# Patient Record
Sex: Male | Born: 1980 | Race: Black or African American | Hispanic: No | Marital: Married | Smoking: Never smoker
Health system: Southern US, Community
[De-identification: ages and names within clinical notes are randomized; demographics above are authoritative.]

## PROBLEM LIST (undated history)

## (undated) DIAGNOSIS — J309 Allergic rhinitis, unspecified: Secondary | ICD-10-CM

## (undated) DIAGNOSIS — G4730 Sleep apnea, unspecified: Secondary | ICD-10-CM

## (undated) DIAGNOSIS — K219 Gastro-esophageal reflux disease without esophagitis: Secondary | ICD-10-CM

## (undated) HISTORY — DX: Sleep apnea, unspecified: G47.30

## (undated) HISTORY — DX: Gastro-esophageal reflux disease without esophagitis: K21.9

## (undated) HISTORY — DX: Allergic rhinitis, unspecified: J30.9

---

## 2003-08-30 ENCOUNTER — Emergency Department (HOSPITAL_COMMUNITY): Admission: EM | Admit: 2003-08-30 | Discharge: 2003-08-31 | Payer: Self-pay | Admitting: *Deleted

## 2004-07-23 ENCOUNTER — Emergency Department (HOSPITAL_COMMUNITY): Admission: EM | Admit: 2004-07-23 | Discharge: 2004-07-24 | Payer: Self-pay | Admitting: Emergency Medicine

## 2005-03-29 IMAGING — CR DG CHEST 2V
2 series · 2 of 2 positions shown · non-contrast
Comparison: none

CLINICAL DATA: Cough, fever.
 CHEST TWO VIEWS
 No prior study for comparison.
 Normal heart size, mediastinal contours and vascularity.  Lungs clear.  Bones unremarkable.  
 IMPRESSION
 No acute abnormalities.

[view not recorded (1 of 2)]
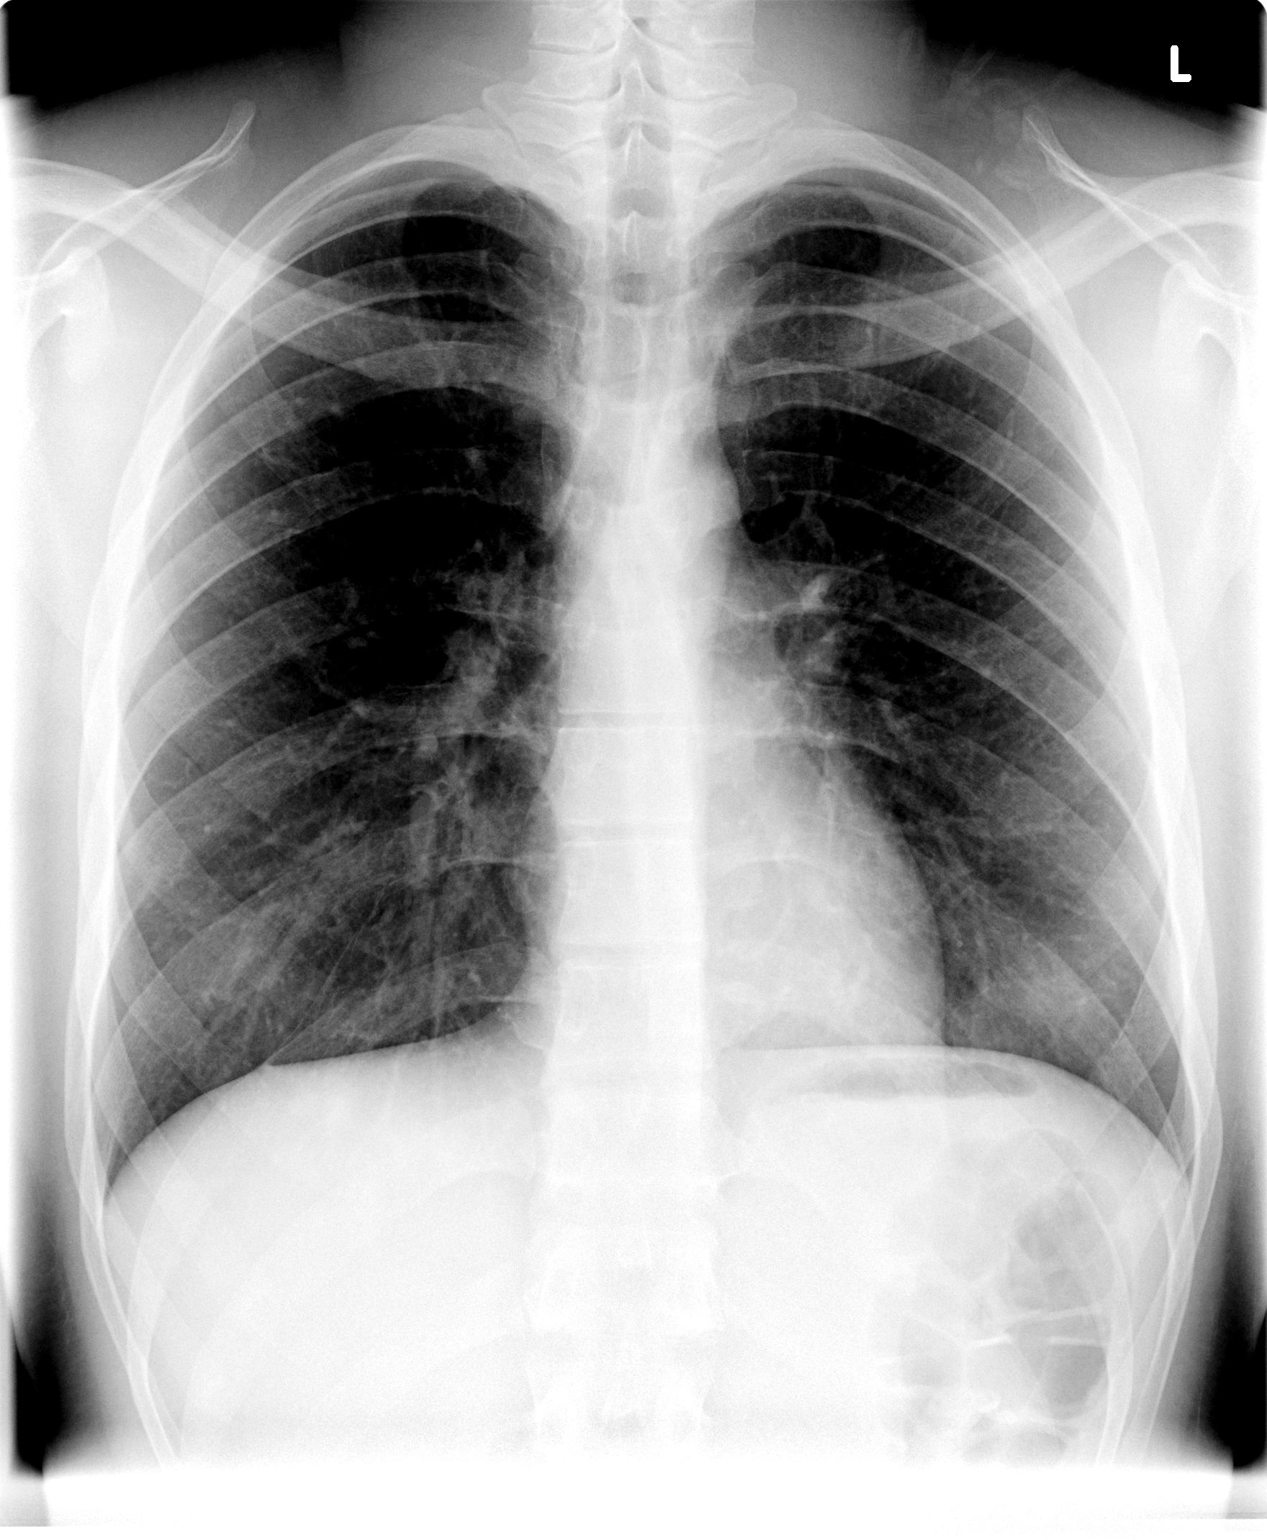

[view not recorded (2 of 2)]
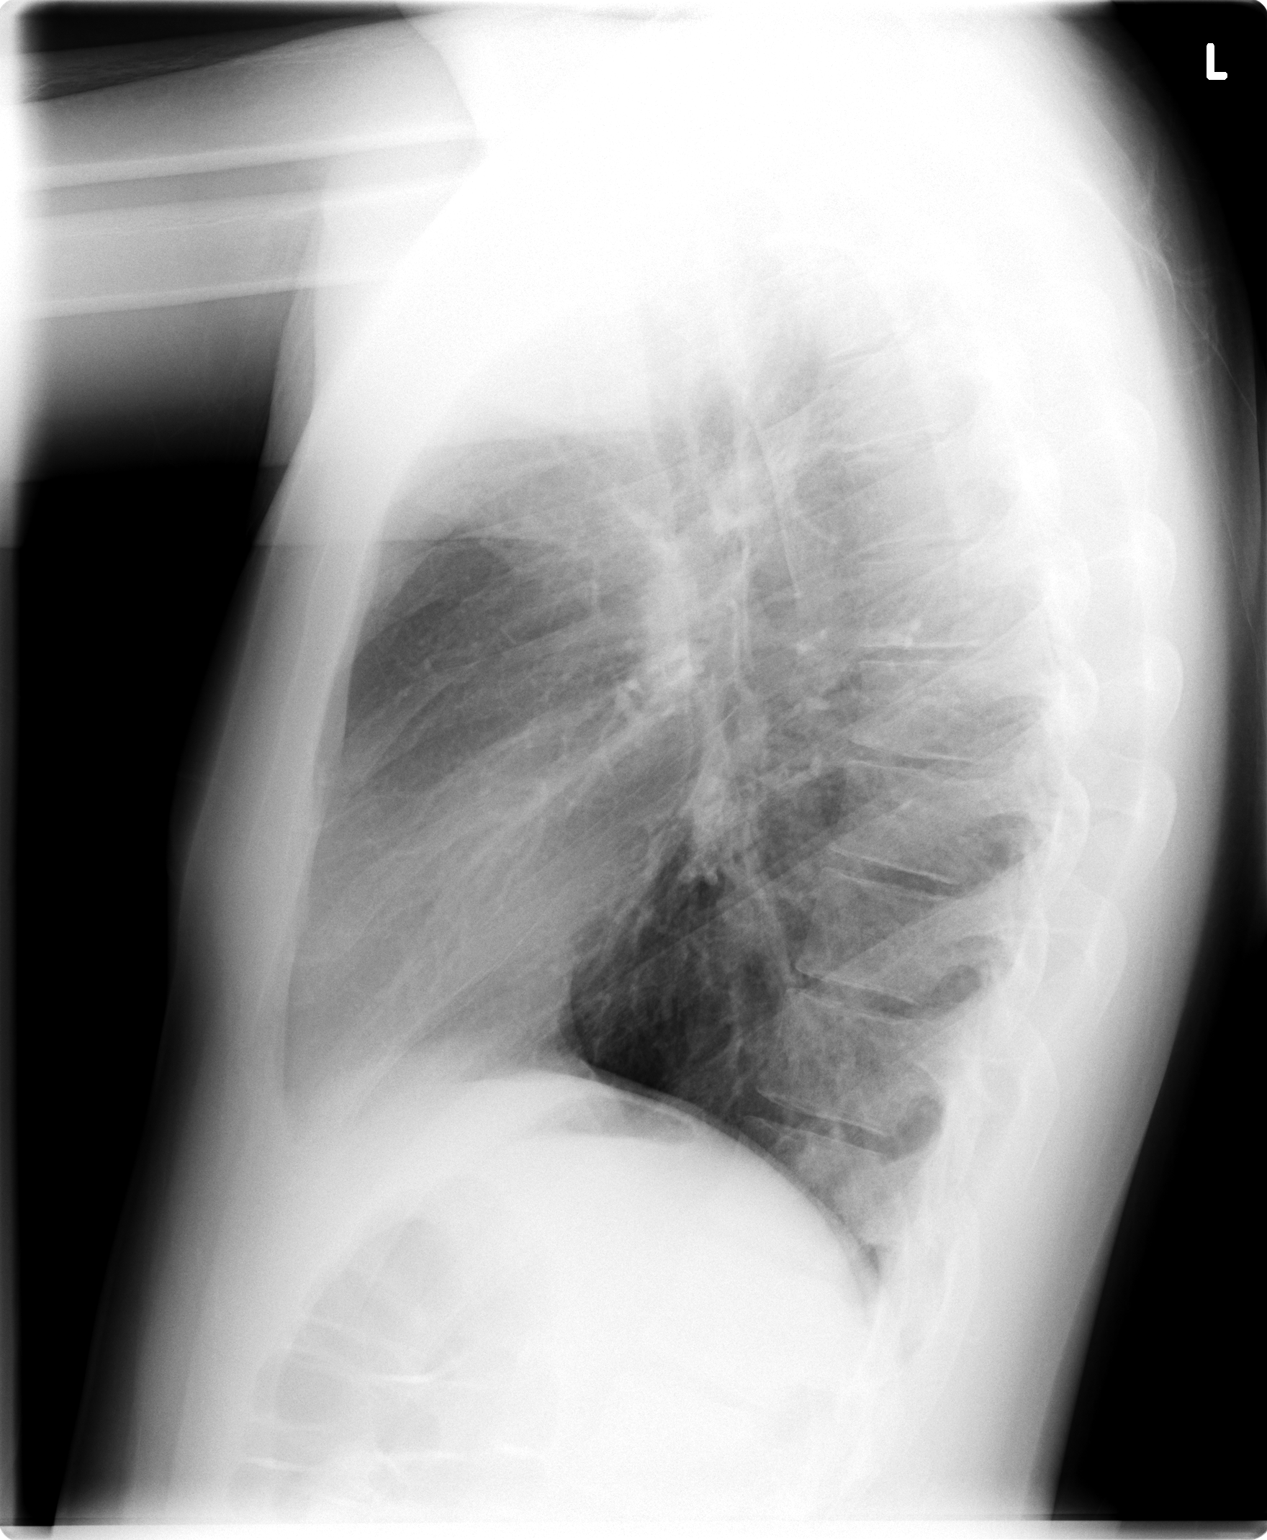

[2 of 2 positions shown; findings below may reference images not displayed]

## 2005-07-03 ENCOUNTER — Ambulatory Visit: Payer: Self-pay | Admitting: Family Medicine

## 2006-02-20 IMAGING — CR DG CHEST 1V PORT
1 series · 1 of 1 positions shown · non-contrast
Comparison: 08/30/03.

CLINICAL DATA: Chest pain. 
 PORTABLE CHEST - 1 VIEW, 07/23/04:

[view not recorded]
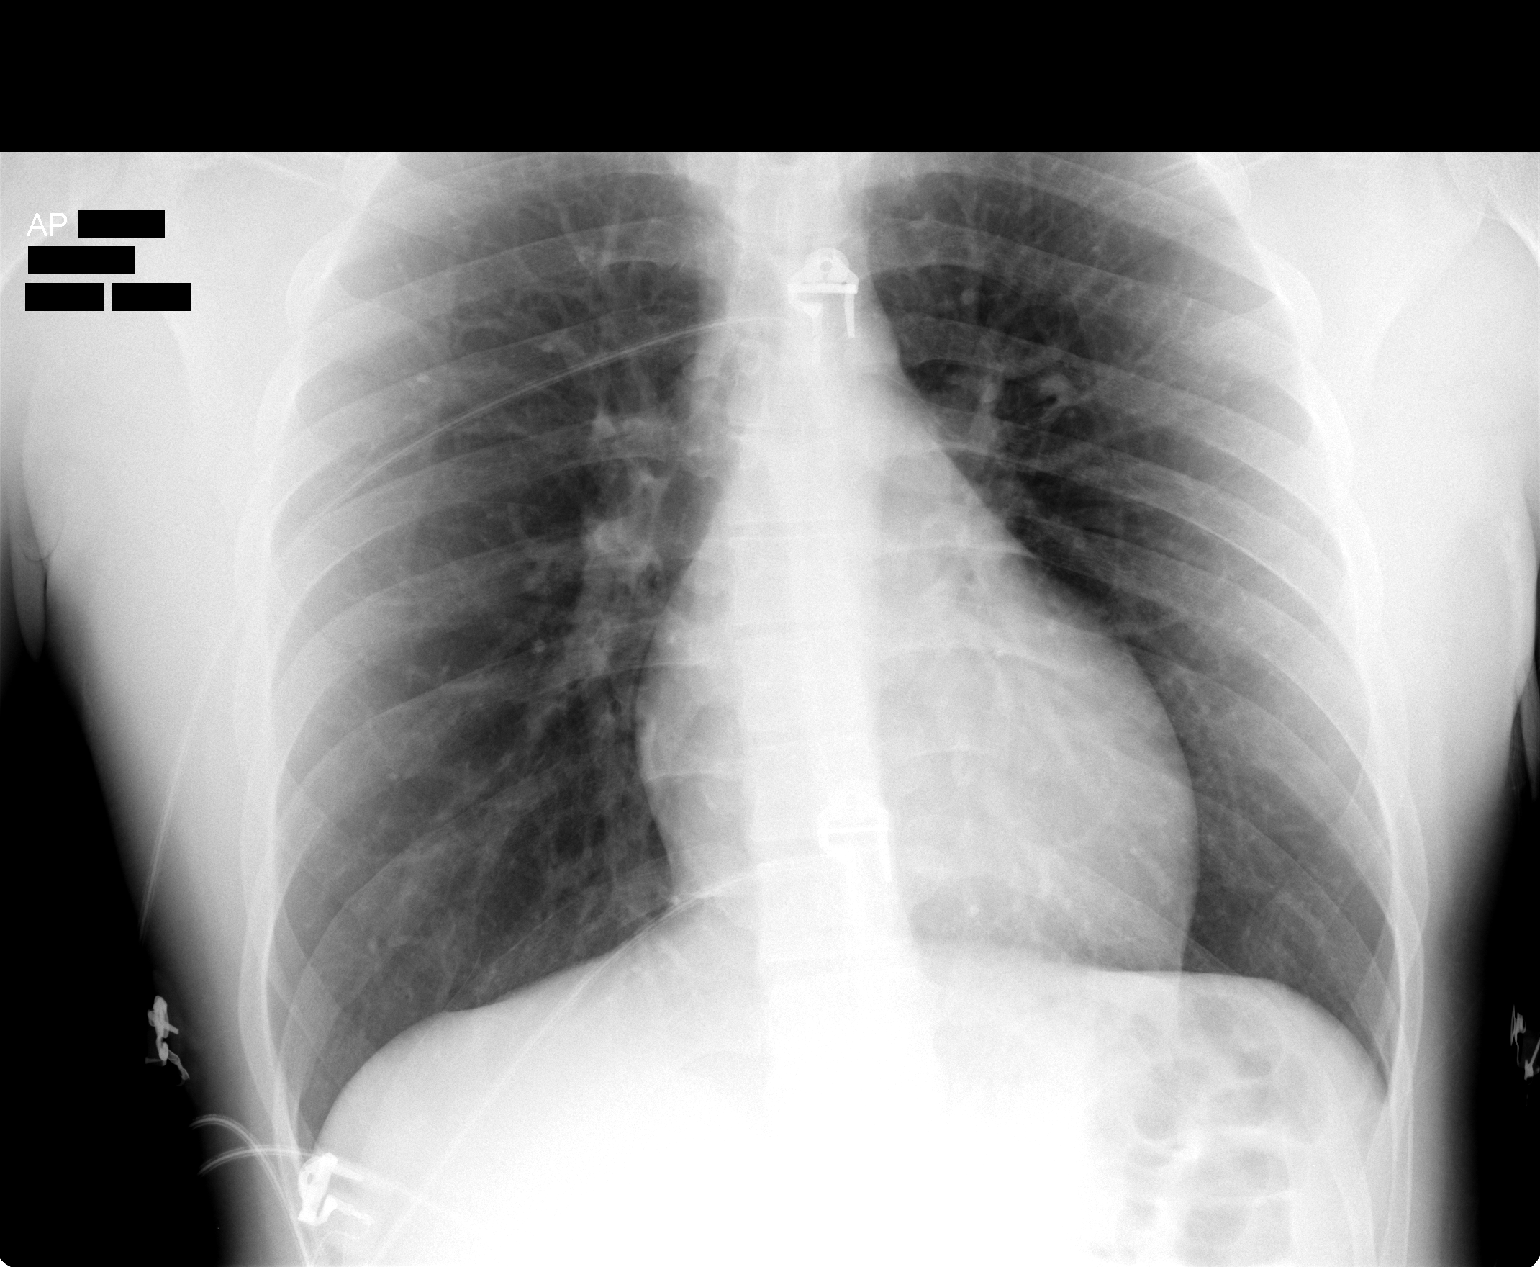

[1 of 1 positions shown; findings below may reference images not displayed]

The heart size and mediastinal contours are within normal limits. The lungs are clear.
IMPRESSION: No acute disease.

## 2007-07-17 ENCOUNTER — Encounter: Payer: Self-pay | Admitting: Family Medicine

## 2010-08-15 NOTE — Letter (Signed)
Summary: Historic Patient File  Historic Patient File   Imported By: Lind Guest 04/17/2010 12:50:40  _____________________________________________________________________  External Attachment:    Type:   Image     Comment:   External Document

## 2016-01-04 ENCOUNTER — Other Ambulatory Visit (HOSPITAL_COMMUNITY): Payer: Self-pay | Admitting: Respiratory Therapy

## 2016-01-04 DIAGNOSIS — G4733 Obstructive sleep apnea (adult) (pediatric): Secondary | ICD-10-CM

## 2019-05-15 ENCOUNTER — Other Ambulatory Visit: Payer: Self-pay

## 2019-05-15 DIAGNOSIS — Z20822 Contact with and (suspected) exposure to covid-19: Secondary | ICD-10-CM

## 2019-05-16 LAB — NOVEL CORONAVIRUS, NAA: SARS-CoV-2, NAA: DETECTED — AB

## 2019-06-17 ENCOUNTER — Other Ambulatory Visit: Payer: Self-pay

## 2019-06-17 DIAGNOSIS — Z20822 Contact with and (suspected) exposure to covid-19: Secondary | ICD-10-CM

## 2019-06-21 LAB — NOVEL CORONAVIRUS, NAA: SARS-CoV-2, NAA: NOT DETECTED

## 2021-08-21 ENCOUNTER — Ambulatory Visit: Payer: Self-pay | Admitting: Urology

## 2021-09-04 ENCOUNTER — Ambulatory Visit (INDEPENDENT_AMBULATORY_CARE_PROVIDER_SITE_OTHER): Payer: BC Managed Care – PPO | Admitting: Urology

## 2021-09-04 ENCOUNTER — Other Ambulatory Visit: Payer: Self-pay

## 2021-09-04 ENCOUNTER — Encounter: Payer: Self-pay | Admitting: Urology

## 2021-09-04 VITALS — BP 152/83 | HR 91 | Ht 77.0 in | Wt 261.0 lb

## 2021-09-04 DIAGNOSIS — Z3009 Encounter for other general counseling and advice on contraception: Secondary | ICD-10-CM

## 2021-09-04 MED ORDER — ALPRAZOLAM 1 MG PO TABS: ORAL_TABLET | ORAL | 0 refills | Status: DC

## 2021-09-04 NOTE — Progress Notes (Signed)
Assessment: 1. Encounter for vasectomy counseling    Plan: Prescription for alprazolam provided. Schedule for vasectomy per patient request.  Chief Complaint:  Chief Complaint  Patient presents with   VAS Consult    History of Present Illness:  Gary Hubbard is a 41 y.o. year old male who is seen for evaluation for vasectomy. He is married with 3 children.  No history of scrotal trauma or infection.  Past Medical History:  Past Medical History:  Diagnosis Date   GERD (gastroesophageal reflux disease)     Past Surgical History:  History reviewed. No pertinent surgical history.  Allergies:  No Known Allergies  Family History:  History reviewed. No pertinent family history.  Social History:  Social History   Tobacco Use   Smoking status: Never   Smokeless tobacco: Never  Substance Use Topics   Alcohol use: Never    Review of symptoms:  Constitutional:  Negative for unexplained weight loss, night sweats, fever, chills ENT:  Negative for nose bleeds, sinus pain, painful swallowing CV:  Negative for chest pain, shortness of breath, exercise intolerance, palpitations, loss of consciousness Resp:  Negative for cough, wheezing, shortness of breath GI:  Negative for nausea, vomiting, diarrhea, bloody stools GU:  Positives noted in HPI; otherwise negative for gross hematuria, dysuria, urinary incontinence Neuro:  Negative for seizures, poor balance, limb weakness, slurred speech Psych:  Negative for lack of energy, depression, anxiety Endocrine:  Negative for polydipsia, polyuria, symptoms of hypoglycemia (dizziness, hunger, sweating) Hematologic:  Negative for anemia, purpura, petechia, prolonged or excessive bleeding, use of anticoagulants  Allergic:  Negative for difficulty breathing or choking as a result of exposure to anything; no shellfish allergy; no allergic response (rash/itch) to materials, foods  Physical exam: BP (!) 152/83    Pulse 91    Ht 6\' 5"   (1.956 m)    Wt 261 lb (118.4 kg)    BMI 30.95 kg/m  GENERAL APPEARANCE:  Well appearing, well developed, well nourished, NAD HEENT:  Atraumatic, normocephalic, oropharynx clear NECK:  Supple without lymphadenopathy or thyromegaly ABDOMEN:  Soft, non-tender, no masses EXTREMITIES:  Moves all extremities well, without clubbing, cyanosis, or edema NEUROLOGIC:  Alert and oriented x 3, normal gait, CN II-XII grossly intact MENTAL STATUS:  appropriate BACK:  Non-tender to palpation, No CVAT SKIN:  Warm, dry, and intact GU: Penis:  circumcised Meatus: Normal Scrotum: Thick cords bilaterally; vasa palpated bilaterally Testis: normal without masses bilateral Epididymis: normal  Results: None  VASECTOMY CONSULTATION  [name] presents for vasectomy consultation today.  He is a 41 y.o. male, Married with 3  children .  He and his wife have discussed the issues regarding long-term fertility and are comfortable with this decision.  He presents for consideration for vasectomy.  I discussed the issues in detail with him today and he expressed no reservations.  As to the procedure, no scalpel technique vasectomy is explained and reviewed in detail.  Generalized risks including but not limited to bleeding, infection, orchalgia, testicular atrophy, epididymitis, scrotal hematoma, and chronic pain are discussed.   Additionally, he understands that the possibility of vas recanalization following vasectomy is possible although rare.  Most importantly, the patient understands that he is not sterile initially and will need a semen analysis check to confirm sterility such that no sperm are seen.  He is advised to avoid ejaculation for 10 days following the procedure.  The initial semen analysis will be checked in approximately 12 weeks and in some patients,  several months may be required for clearance of all sperm.  He reports a clear understanding of the need for continued birth control until sterility is  confirmed.  Otherwise, general issues regarding local anesthesia, prep, alprazolam are discussed and he reports a clear understanding.

## 2021-09-04 NOTE — Patient Instructions (Signed)
Taking care of yourself after a VASECTOMY ? ?                                            Patient Information Sheet ? ?      ?The following information will reinforce some of the instructions that your doctor has given you. ? ?Day of Procedure: ?1) Wear the scrotal supporter and gauze pad ?2) Use an ice pack on the scrotum for 15 minutes every hour for 48 hours to help reduce discomfort, swelling and bruising (do NOT place ice directly on your skin, but place on top of the supporter) ?3) Expect some clear to pinkish drainage at the surgical site for the first 24-48 hours ?4) If needed, use pain medications provided or ibuprofen 800 mg every 8 hours for discomfort ?5) Avoid strenuous activities like mowing, lifting, jogging and exercising for 1 week.  Take it easy! ?6) If you develop a fever over 101 F or sudden onset of significant swelling within the first 12 hours, please call to report this to your doctor as soon as possible.   ? ? ?Day Two and Three: ?1) You may take a shower, but avoid tubs, pools or hot tubs. ?2) Continue to wear the scrotal supporter as needed for comfort and change or remove the gauze pad if desired ?3) Keep taking it easy!  Avoid strenuous activities like mowing, lifting, jogging and exercising.   ?4) Continue to watch for signs or symptoms of fever or significant swelling ?5) Apply a small amount of antibiotic ointment to incision 1-2 times/day ? ?The rest of the week: ?1) Gradually return to normal physical activities after one week.  A return of soreness might mean you are  ?      ?doing too much too soon?. ?2) Avoid sexual activity for 10 days after the procedure ?3) Continue to take a shower, but avoid tubs, pools or hot tubs ?4) Wearing the scrotal supporter is optional based on your comfort.   ? ? ?Remember to use an alternate form of contraception for 3 months until you have been checked and CLEARED by your urologist! ? ?3-Month Urology Clinic: ? 1) The urologist will need to look at  a semen sample under a microscope ? 2) Use the specimen cup provided to collect the sample AT HOME 1 hour before the appointment ? 3) DO NOT refrigerate the specimen, but keep at room or body temperature ? 4) Avoid ejaculation for 2-5 days before collecting the specimen ? 5) Collect the entire specimen by masturbation using NO lubricant ? 6) Make sure your name, MR number, date and time of collection are on the cup  ?

## 2021-10-12 ENCOUNTER — Ambulatory Visit (INDEPENDENT_AMBULATORY_CARE_PROVIDER_SITE_OTHER): Payer: BC Managed Care – PPO | Admitting: Urology

## 2021-10-12 VITALS — BP 126/65 | HR 86

## 2021-10-12 DIAGNOSIS — Z302 Encounter for sterilization: Secondary | ICD-10-CM

## 2021-10-12 MED ORDER — HYDROCODONE-ACETAMINOPHEN 5-325 MG PO TABS: 1.0000 | ORAL_TABLET | Freq: Four times a day (QID) | ORAL | 0 refills | Status: DC | PRN

## 2021-10-12 MED ORDER — CEPHALEXIN 500 MG PO CAPS: 500.0000 mg | ORAL_CAPSULE | Freq: Three times a day (TID) | ORAL | 0 refills | Status: AC

## 2021-10-12 NOTE — Progress Notes (Signed)
? ?  Assessment: ?1. Encounter for vasectomy   ? ? ?Plan: ?Postvasectomy instructions given. ?Prescription for pain medication and antibiotics provided. ?Post vasectomy semen analysis in 12 weeks. ? ?Chief Complaint:  ?Chief Complaint  ?Patient presents with  ? VAS  ? ? ?History of Present Illness: ? ?Gary Hubbard is a 41 y.o. year old male who is seen for  vasectomy. ?He is married with 3 children.  No history of scrotal trauma or infection. ? ?Past Medical History:  ?Past Medical History:  ?Diagnosis Date  ? GERD (gastroesophageal reflux disease)   ? ? ?Past Surgical History:  ?No past surgical history on file. ? ?Allergies:  ?No Known Allergies ? ?Family History:  ?No family history on file. ? ?Social History:  ?Social History  ? ?Tobacco Use  ? Smoking status: Never  ? Smokeless tobacco: Never  ?Substance Use Topics  ? Alcohol use: Never  ? ? ?ROS: ?Constitutional:  Negative for fever, chills, weight loss ?CV: Negative for chest pain, previous MI, hypertension ?Respiratory:  Negative for shortness of breath, wheezing, sleep apnea, frequent cough ?GI:  Negative for nausea, vomiting, bloody stool, GERD ? ?Physical exam: ?BP 126/65   Pulse 86  ?GENERAL APPEARANCE:  Well appearing, well developed, well nourished, NAD ?HEENT:  Atraumatic, normocephalic, oropharynx clear ?NECK:  Supple without lymphadenopathy or thyromegaly ?ABDOMEN:  Soft, non-tender, no masses ?EXTREMITIES:  Moves all extremities well, without clubbing, cyanosis, or edema ?NEUROLOGIC:  Alert and oriented x 3, normal gait, CN II-XII grossly intact ?MENTAL STATUS:  appropriate ?BACK:  Non-tender to palpation, No CVAT ?SKIN:  Warm, dry, and intact ? ?Results: ?None ? ?VASECTOMY PROCEDURE: ? ?Gary Hubbard presents for vasectomy following previous vasectomy consultation and permit is signed.  The patient's anterior scrotal wall is shaved and prepped with Betadine in standard sterile fashion.  1% lidocaine is used as local anesthetic in the scrotal  and peri vasal tissue.  ?A standard median raphe punch incision is made and a no scalpel technique vasectomy is performed.  Bilateral vas are isolated from the peri vasal tissue and an approximately 1 cm segment of vas is excised.  Proximal and distal segments are internally cauterized with electric heat cautery. Interposition of perivasal tissue was performed.  Bilateral palpation confirms bilateral vasectomy defect and no significant bleeding or hematoma is identified.  A second punch incision was required to isolate the left vas.  Neosporin gauze dressing and a scrotal support are applied.  ?  ?Disposition: Patient is discharged home with Rx for pain medication and antibiotics.  Patient is given routine vasectomy instructions.   Most importantly, he is instructed and cautioned again regarding the need for protected intercourse until such time that a single  negative semen analysis has been obtained.  The initial semen analysis will be checked in approximately 12  weeks.  The patient reports a clear understanding.  He will call with any interval questions or concerns. ? ?

## 2021-10-12 NOTE — Patient Instructions (Signed)
Vasectomy Postoperative Instructions  Please bring back a semen analysis in approximately 3 months.  Your semen analysis  will need to be taken to  [facility] [address] [place], [place] [address] There phone number is [phone]. Please call and schedule an appointment before taking your specimen.    You will be given a sterile specimen cup. Please label the cup with your name, date of birth, date and time of collections.  What to Expect  - slight redness, swelling and scant drainage along the incision  - mild to moderate discomfort  - black and blue (bruising) as the tissue heals  - low grade fever  - scrotal sensitivity and/or tenderness - Edges of the incision may pull apart and heal slowly, sometimes a knot may be present which remains for several months.  This is NORMAL and all part of the healing process. - if stitches are placed, they do not need to be removed - if you have pain or discomfort immediately after the vasectomy, you may use OTC pain medication for relief , ex: tylenol.  After local anesthetic wears off an ice pack will provide additional comfort and can also prevent swelling if used  Activity  - no sexual intercourse for at lease 5 days depending on comfort  - no heavy lifting for 48-72 hours (anything over 5-10 lbs)  Wound Care  - shower only after 24 hours  - no tub baths, hot tub, or pools for at least 7 days  - ice packs for 48 hours: 30 minutes on and 30 minutes off  Problem to Report  - generalized redness  - increased pain and swelling  - fever greater than 101 F  - significant drainage or bleeding from the wound  TO DO - Ejaculations help to clear the passage of sperm, but you must use another from of birth control until you are told you may discontinue its use!! - You will be given a specimen cup to bring back a semen sample in 3 months to check and see if its clear of sperm.  Only after the semen is sent for analysis and is reported back as clear  should you use this as your primary form of birth control!   

## 2023-01-09 NOTE — Progress Notes (Unsigned)
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 Wife describes his snoring, occasional witnessed apnea, tossing and turning.  He says occasionally he feels as if somebody is choking him and his sleep that he has startled awake and jumped out of bed. Has taken Tylenol PM or Benadryl occasionally to help sleep.  5 cups of coffee a day.0 No complex parasomnias. No ENT surgery, heart or lung disease.  Prior to Admission medications   Medication Sig Start Date End Date Taking? Authorizing Provider  pantoprazole (PROTONIX) 40 MG tablet Take 1 tablet by mouth daily. 05/28/19  Yes [provider]   Past Medical History:  Diagnosis Date   Allergic rhinitis    GERD (gastroesophageal reflux disease)    Sleep apnea    History reviewed. No pertinent surgical history. 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  Tobacco Use   Smoking status: Never   Smokeless tobacco: Never  Substance and Sexual Activity   Alcohol use: Never   Drug use: Not Currently   Sexual activity: Yes  Other Topics Concern   Not on file  Social History Narrative   Not on file   Social Determinants of Health   Financial Resource Strain: Not on file  Food Insecurity: Not on file  Transportation Needs: Not on file  Physical Activity: Not on file  Stress: Not on file  Social Connections: Not on file  Intimate Partner Violence: Not on file   ROS-see HPI   + = positive Constitutional:    weight loss, night sweats,  fevers, chills, fatigue, lassitude. HEENT:    headaches, difficulty swallowing, tooth/dental problems, sore throat,       sneezing, itching, ear ache, nasal congestion, post nasal drip, snoring CV:    chest pain, orthopnea, PND, swelling in lower extremities, anasarca,          dizziness, palpitations Resp:   shortness of breath with exertion or at rest.                productive cough,   non-productive cough, coughing up of blood.              change in color of mucus.  wheezing.   Skin:    rash or lesions. GI:  No-   heartburn, indigestion, abdominal pain, nausea, vomiting, diarrhea,                 change in bowel habits, loss of appetite GU: dysuria, change in color of urine, no urgency or frequency.   flank pain. MS:   joint pain, stiffness, decreased range of motion, back pain. Neuro-     nothing unusual Psych:  change in mood or affect.  depression or anxiety.   memory loss.  OBJ- Physical Exam General- Alert, Oriented, Affect-appropriate, Distress- none acute, +tall/ big man Skin- rash-none, lesions- none, excoriation- none Lymphadenopathy- none Head- atraumatic  Eyes- Gross vision intact, PERRLA, conjunctivae and secretions clear            Ears- Hearing, canals-normal            Nose- Clear, no-Septal dev, mucus, polyps, erosion, perforation             Throat- Mallampati IV , mucosa clear , drainage- none, tonsils- atrophic, +teeth Neck- flexible , trachea midline, no stridor , thyroid nl, carotid no bruit Chest - symmetrical excursion , unlabored           Heart/CV- RRR , no murmur , no gallop  , no rub, nl s1 s2                           - JVD- none , edema- none, stasis changes- none, varices- none           Lung- clear to P&A, wheeze- none, cough- none , dullness-none, rub- none           Chest wall-  Abd-  Br/ Gen/ Rectal- Not done, not indicated Extrem- cyanosis- none, clubbing, none, atrophy- none, strength- nl Neuro- grossly intact to observation

## 2023-01-10 ENCOUNTER — Encounter: Payer: Self-pay | Admitting: Internal Medicine

## 2023-01-10 ENCOUNTER — Ambulatory Visit (INDEPENDENT_AMBULATORY_CARE_PROVIDER_SITE_OTHER): Payer: BC Managed Care – PPO | Admitting: Internal Medicine

## 2023-01-10 VITALS — BP 130/78 | HR 75 | Ht 77.0 in | Wt 279.4 lb

## 2023-01-10 DIAGNOSIS — F5101 Primary insomnia: Secondary | ICD-10-CM

## 2023-01-10 DIAGNOSIS — R0683 Snoring: Secondary | ICD-10-CM | POA: Diagnosis not present

## 2023-01-10 DIAGNOSIS — G4700 Insomnia, unspecified: Secondary | ICD-10-CM | POA: Insufficient documentation

## 2023-01-10 NOTE — Assessment & Plan Note (Signed)
Probable obstructive sleep apnea.  Appropriate discussion done and questions answered. Plan-schedule sleep study.  He is significantly claustrophobic and that may impact treatment options.

## 2023-01-10 NOTE — Patient Instructions (Signed)
Order- schedule home sleep test     dx snoring  Pleaase call us about 2 weeks after your sleep test for results and recommendations

## 2023-01-10 NOTE — Assessment & Plan Note (Signed)
Mostly treated OTC with antihistamine based products.  Initial discussion done.  We will see if he needs more help once sleep study is completed.

## 2023-02-13 ENCOUNTER — Encounter (INDEPENDENT_AMBULATORY_CARE_PROVIDER_SITE_OTHER): Payer: BC Managed Care – PPO

## 2023-02-13 DIAGNOSIS — R0683 Snoring: Secondary | ICD-10-CM

## 2023-02-13 DIAGNOSIS — G4733 Obstructive sleep apnea (adult) (pediatric): Secondary | ICD-10-CM | POA: Diagnosis not present

## 2023-02-20 ENCOUNTER — Telehealth: Payer: Self-pay | Admitting: Internal Medicine

## 2023-02-20 DIAGNOSIS — G4733 Obstructive sleep apnea (adult) (pediatric): Secondary | ICD-10-CM

## 2023-02-20 NOTE — Telephone Encounter (Signed)
Sleep study showed moderate obstructive sleep apnea, averaging 16.8 apneas/ hour.  I recommend we order new DME, new CPAP auto 5-20, mask of choicee, humidigfier, supplies, AirView/ card  He will need f/u ov with me in 31-90 days please

## 2023-02-20 NOTE — Telephone Encounter (Signed)
Sleep Study on 7/8 have been scanned into the chart.

## 2023-02-21 NOTE — Telephone Encounter (Signed)
Patient aware of results and recommendations. Order placed.

## 2023-05-14 ENCOUNTER — Telehealth: Payer: Self-pay

## 2023-05-14 NOTE — Telephone Encounter (Signed)
Patient had a vasectomy done in 2023. He did not have the semen analysis done. Wanting to know what he needs to do to have the analysis done.  Please advise.

## 2023-05-14 NOTE — Telephone Encounter (Signed)
Patient can be seen by Dr. Pete Glatter in [place] or either get established by another provider here in the office.

## 2023-05-22 NOTE — Progress Notes (Deleted)
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 Wife describes his snoring, occasional witnessed apnea, tossing and turning.  He says occasionally he feels as if somebody is choking him and his sleep that he has startled awake and jumped out of bed. Has taken Tylenol PM or Benadryl occasionally to help sleep.  5 cups of coffee a day.0 No complex parasomnias. No ENT surgery, heart or lung disease.  05/23/23- 41 yoM never smoker followed for OSA, complicated by GERD HST 01/21/23- AHI (3%) 16.8/hr, desat to 86%, body weight 278 lbs CPAP auto 5-20/ [employer]- new 02/21/23 Body weight today-      ROS-see HPI   + = positive Constitutional:    weight loss, night sweats, fevers, chills, fatigue, lassitude. HEENT:    headaches, difficulty swallowing, tooth/dental problems, sore throat,       sneezing, itching, ear ache, nasal congestion, post nasal drip, snoring CV:    chest pain, orthopnea, PND, swelling in lower extremities, anasarca,          dizziness, palpitations Resp:   shortness of breath with exertion or at rest.                productive cough,   non-productive cough, coughing up of blood.              change in color of mucus.  wheezing.   Skin:    rash or lesions. GI:  No-   heartburn, indigestion, abdominal pain, nausea, vomiting, diarrhea,                 change in bowel habits, loss of appetite GU: dysuria, change in color of urine, no urgency or frequency.   flank pain. MS:   joint pain, stiffness, decreased range of motion, back pain. Neuro-     nothing unusual Psych:  change in mood or affect.  depression or anxiety.   memory loss.  OBJ- Physical Exam General- Alert, Oriented, Affect-appropriate, Distress- none acute, +tall/ big man Skin- rash-none, lesions- none, excoriation-  none Lymphadenopathy- none Head- atraumatic            Eyes- Gross vision intact, PERRLA, conjunctivae and secretions clear            Ears- Hearing, canals-normal            Nose- Clear, no-Septal dev, mucus, polyps, erosion, perforation             Throat- Mallampati IV , mucosa clear , drainage- none, tonsils- atrophic, +teeth Neck- flexible , trachea midline, no stridor , thyroid nl, carotid no bruit Chest - symmetrical excursion , unlabored           Heart/CV- RRR , no murmur , no gallop  , no rub, nl s1 s2                           - JVD- none , edema- none, stasis changes- none, varices- none           Lung- clear to P&A, wheeze- none, cough- none , dullness-none, rub- none           Chest wall-  Abd-  Br/ Gen/ Rectal- Not done, not indicated Extrem- cyanosis- none, clubbing, none, atrophy- none, strength- nl Neuro- grossly intact to  observation

## 2023-05-23 ENCOUNTER — Ambulatory Visit: Payer: BC Managed Care – PPO | Admitting: Internal Medicine

## 2023-05-23 ENCOUNTER — Encounter: Payer: Self-pay | Admitting: Internal Medicine

## 2023-06-03 ENCOUNTER — Ambulatory Visit: Payer: BC Managed Care – PPO | Admitting: Urology

## 2023-06-06 ENCOUNTER — Other Ambulatory Visit: Payer: Self-pay | Admitting: Urology

## 2023-06-07 LAB — POST-VAS SPERM EVALUATION,QUAL: Volume: 1.2 mL
